# Patient Record
Sex: Female | Born: 1970 | Race: White | Hispanic: No | Marital: Married | Smoking: Never smoker
Health system: Southern US, Community
[De-identification: ages and names within clinical notes are randomized; demographics above are authoritative.]

## PROBLEM LIST (undated history)

## (undated) DIAGNOSIS — Z86018 Personal history of other benign neoplasm: Secondary | ICD-10-CM

## (undated) DIAGNOSIS — R87619 Unspecified abnormal cytological findings in specimens from cervix uteri: Secondary | ICD-10-CM

## (undated) HISTORY — PX: ABDOMINAL HYSTERECTOMY: SHX81

## (undated) HISTORY — DX: Unspecified abnormal cytological findings in specimens from cervix uteri: R87.619

## (undated) HISTORY — DX: Personal history of other benign neoplasm: Z86.018

---

## 1995-10-08 HISTORY — PX: BREAST BIOPSY: SHX20

## 1996-10-07 DIAGNOSIS — R87619 Unspecified abnormal cytological findings in specimens from cervix uteri: Secondary | ICD-10-CM

## 1996-10-07 HISTORY — DX: Unspecified abnormal cytological findings in specimens from cervix uteri: R87.619

## 2011-10-08 HISTORY — PX: PARTIAL HYSTERECTOMY: SHX80

## 2014-12-14 ENCOUNTER — Ambulatory Visit: Payer: Self-pay | Admitting: Family Medicine

## 2015-08-30 ENCOUNTER — Encounter: Payer: Self-pay | Admitting: Obstetrics and Gynecology

## 2015-08-30 ENCOUNTER — Telehealth: Payer: Self-pay | Admitting: Obstetrics and Gynecology

## 2015-08-30 ENCOUNTER — Ambulatory Visit (INDEPENDENT_AMBULATORY_CARE_PROVIDER_SITE_OTHER): Payer: BLUE CROSS/BLUE SHIELD | Admitting: Obstetrics and Gynecology

## 2015-08-30 VITALS — BP 132/76 | HR 83 | Ht 70.0 in | Wt 180.2 lb

## 2015-08-30 DIAGNOSIS — Z01419 Encounter for gynecological examination (general) (routine) without abnormal findings: Secondary | ICD-10-CM | POA: Diagnosis not present

## 2015-08-30 DIAGNOSIS — Z1239 Encounter for other screening for malignant neoplasm of breast: Secondary | ICD-10-CM

## 2015-08-30 DIAGNOSIS — N941 Unspecified dyspareunia: Secondary | ICD-10-CM | POA: Diagnosis not present

## 2015-08-30 DIAGNOSIS — N760 Acute vaginitis: Secondary | ICD-10-CM

## 2015-08-30 DIAGNOSIS — N898 Other specified noninflammatory disorders of vagina: Secondary | ICD-10-CM | POA: Diagnosis not present

## 2015-08-30 MED ORDER — FLUCONAZOLE 150 MG PO TABS: 150.0000 mg | ORAL_TABLET | Freq: Once | ORAL | Status: DC

## 2015-08-30 MED ORDER — METRONIDAZOLE 500 MG PO TABS: 500.0000 mg | ORAL_TABLET | Freq: Two times a day (BID) | ORAL | Status: DC

## 2015-08-30 MED ORDER — ESTROGENS, CONJUGATED 0.625 MG/GM VA CREA: 1.0000 | TOPICAL_CREAM | Freq: Every day | VAGINAL | Status: DC

## 2015-08-30 MED ORDER — ESTRADIOL 10 MCG VA TABS: 1.0000 | ORAL_TABLET | VAGINAL | Status: DC

## 2015-08-30 NOTE — Telephone Encounter (Signed)
Patient called, noted medication was too expensive, desired alternative.  Called in Premarin cream and advised on instructions for use.

## 2015-08-30 NOTE — Patient Instructions (Addendum)
Patient to take course of Flagyl, followed by Diflucan tablet.  To begin taking Vagifem tablet 1 week after treatment antibiotic treatment.     Health Maintenance, Female Adopting a healthy lifestyle and getting preventive care can go a long way to promote health and wellness. Talk with your health care provider about what schedule of regular examinations is right for you. This is a good chance for you to check in with your provider about disease prevention and staying healthy. In between checkups, there are plenty of things you can do on your own. Experts have done a lot of research about which lifestyle changes and preventive measures are most likely to keep you healthy. Ask your health care provider for more information. WEIGHT AND DIET  Eat a healthy diet  Be sure to include plenty of vegetables, fruits, low-fat dairy products, and lean protein.  Do not eat a lot of foods high in solid fats, added sugars, or salt.  Get regular exercise. This is one of the most important things you can do for your health.  Most adults should exercise for at least 150 minutes each week. The exercise should increase your heart rate and make you sweat (moderate-intensity exercise).  Most adults should also do strengthening exercises at least twice a week. This is in addition to the moderate-intensity exercise.  Maintain a healthy weight  Body mass index (BMI) is a measurement that can be used to identify possible weight problems. It estimates body fat based on height and weight. Your health care provider can help determine your BMI and help you achieve or maintain a healthy weight.  For females 56 years of age and older:   A BMI below 18.5 is considered underweight.  A BMI of 18.5 to 24.9 is normal.  A BMI of 25 to 29.9 is considered overweight.  A BMI of 30 and above is considered obese.  Watch levels of cholesterol and blood lipids  You should start having your blood tested for lipids and  cholesterol at 43 years of age, then have this test every 5 years.  You may need to have your cholesterol levels checked more often if:  Your lipid or cholesterol levels are high.  You are older than 43 years of age.  You are at high risk for heart disease.  CANCER SCREENING   Lung Cancer  Lung cancer screening is recommended for adults 35-70 years old who are at high risk for lung cancer because of a history of smoking.  A yearly low-dose CT scan of the lungs is recommended for people who:  Currently smoke.  Have quit within the past 15 years.  Have at least a 30-pack-year history of smoking. A pack year is smoking an average of one pack of cigarettes a day for 1 year.  Yearly screening should continue until it has been 15 years since you quit.  Yearly screening should stop if you develop a health problem that would prevent you from having lung cancer treatment.  Breast Cancer  Practice breast self-awareness. This means understanding how your breasts normally appear and feel.  It also means doing regular breast self-exams. Let your health care provider know about any changes, no matter how small.  If you are in your 20s or 30s, you should have a clinical breast exam (CBE) by a health care provider every 1-3 years as part of a regular health exam.  If you are 56 or older, have a CBE every year. Also consider having a breast  X-ray (mammogram) every year.  If you have a family history of breast cancer, talk to your health care provider about genetic screening.  If you are at high risk for breast cancer, talk to your health care provider about having an MRI and a mammogram every year.  Breast cancer gene (BRCA) assessment is recommended for women who have family members with BRCA-related cancers. BRCA-related cancers include:  Breast.  Ovarian.  Tubal.  Peritoneal cancers.  Results of the assessment will determine the need for genetic counseling and BRCA1 and BRCA2  testing. Cervical Cancer Your health care provider may recommend that you be screened regularly for cancer of the pelvic organs (ovaries, uterus, and vagina). This screening involves a pelvic examination, including checking for microscopic changes to the surface of your cervix (Pap test). You may be encouraged to have this screening done every 3 years, beginning at age 44.  For women ages 59-65, health care providers may recommend pelvic exams and Pap testing every 3 years, or they may recommend the Pap and pelvic exam, combined with testing for human papilloma virus (HPV), every 5 years. Some types of HPV increase your risk of cervical cancer. Testing for HPV may also be done on women of any age with unclear Pap test results.  Other health care providers may not recommend any screening for nonpregnant women who are considered low risk for pelvic cancer and who do not have symptoms. Ask your health care provider if a screening pelvic exam is right for you.  If you have had past treatment for cervical cancer or a condition that could lead to cancer, you need Pap tests and screening for cancer for at least 20 years after your treatment. If Pap tests have been discontinued, your risk factors (such as having a new sexual partner) need to be reassessed to determine if screening should resume. Some women have medical problems that increase the chance of getting cervical cancer. In these cases, your health care provider may recommend more frequent screening and Pap tests. Colorectal Cancer  This type of cancer can be detected and often prevented.  Routine colorectal cancer screening usually begins at 43 years of age and continues through 43 years of age.  Your health care provider may recommend screening at an earlier age if you have risk factors for colon cancer.  Your health care provider may also recommend using home test kits to check for hidden blood in the stool.  A small camera at the end of a  tube can be used to examine your colon directly (sigmoidoscopy or colonoscopy). This is done to check for the earliest forms of colorectal cancer.  Routine screening usually begins at age 39.  Direct examination of the colon should be repeated every 5-10 years through 44 years of age. However, you may need to be screened more often if early forms of precancerous polyps or small growths are found. Skin Cancer  Check your skin from head to toe regularly.  Tell your health care provider about any new moles or changes in moles, especially if there is a change in a mole's shape or color.  Also tell your health care provider if you have a mole that is larger than the size of a pencil eraser.  Always use sunscreen. Apply sunscreen liberally and repeatedly throughout the day.  Protect yourself by wearing long sleeves, pants, a wide-brimmed hat, and sunglasses whenever you are outside. HEART DISEASE, DIABETES, AND HIGH BLOOD PRESSURE   High blood pressure causes heart  disease and increases the risk of stroke. High blood pressure is more likely to develop in:  People who have blood pressure in the high end of the normal range (130-139/85-89 mm Hg).  People who are overweight or obese.  People who are African American.  If you are 71-41 years of age, have your blood pressure checked every 3-5 years. If you are 58 years of age or older, have your blood pressure checked every year. You should have your blood pressure measured twice--once when you are at a hospital or clinic, and once when you are not at a hospital or clinic. Record the average of the two measurements. To check your blood pressure when you are not at a hospital or clinic, you can use:  An automated blood pressure machine at a pharmacy.  A home blood pressure monitor.  If you are between 61 years and 78 years old, ask your health care provider if you should take aspirin to prevent strokes.  Have regular diabetes screenings. This  involves taking a blood sample to check your fasting blood sugar level.  If you are at a normal weight and have a low risk for diabetes, have this test once every three years after 43 years of age.  If you are overweight and have a high risk for diabetes, consider being tested at a younger age or more often. PREVENTING INFECTION  Hepatitis B  If you have a higher risk for hepatitis B, you should be screened for this virus. You are considered at high risk for hepatitis B if:  You were born in a country where hepatitis B is common. Ask your health care provider which countries are considered high risk.  Your parents were born in a high-risk country, and you have not been immunized against hepatitis B (hepatitis B vaccine).  You have HIV or AIDS.  You use needles to inject street drugs.  You live with someone who has hepatitis B.  You have had sex with someone who has hepatitis B.  You get hemodialysis treatment.  You take certain medicines for conditions, including cancer, organ transplantation, and autoimmune conditions. Hepatitis C  Blood testing is recommended for:  Everyone born from [birth year] through 1964.  Anyone with known risk factors for hepatitis C. Sexually transmitted infections (STIs)  You should be screened for sexually transmitted infections (STIs) including gonorrhea and chlamydia if:  You are sexually active and are younger than 43 years of age.  You are older than 43 years of age and your health care provider tells you that you are at risk for this type of infection.  Your sexual activity has changed since you were last screened and you are at an increased risk for chlamydia or gonorrhea. Ask your health care provider if you are at risk.  If you do not have HIV, but are at risk, it may be recommended that you take a prescription medicine daily to prevent HIV infection. This is called pre-exposure prophylaxis (PrEP). You are considered at risk if:  You are  sexually active and do not regularly use condoms or know the HIV status of your partner(s).  You take drugs by injection.  You are sexually active with a partner who has HIV. Talk with your health care provider about whether you are at high risk of being infected with HIV. If you choose to begin PrEP, you should first be tested for HIV. You should then be tested every 3 months for as long as you are taking  PrEP.  PREGNANCY   If you are premenopausal and you may become pregnant, ask your health care provider about preconception counseling.  If you may become pregnant, take 400 to 800 micrograms (mcg) of folic acid every day.  If you want to prevent pregnancy, talk to your health care provider about birth control (contraception). OSTEOPOROSIS AND MENOPAUSE   Osteoporosis is a disease in which the bones lose minerals and strength with aging. This can result in serious bone fractures. Your risk for osteoporosis can be identified using a bone density scan.  If you are 65 years of age or older, or if you are at risk for osteoporosis and fractures, ask your health care provider if you should be screened.  Ask your health care provider whether you should take a calcium or vitamin D supplement to lower your risk for osteoporosis.  Menopause may have certain physical symptoms and risks.  Hormone replacement therapy may reduce some of these symptoms and risks. Talk to your health care provider about whether hormone replacement therapy is right for you.  HOME CARE INSTRUCTIONS   Schedule regular health, dental, and eye exams.  Stay current with your immunizations.   Do not use any tobacco products including cigarettes, chewing tobacco, or electronic cigarettes.  If you are pregnant, do not drink alcohol.  If you are breastfeeding, limit how much and how often you drink alcohol.  Limit alcohol intake to no more than 1 drink per day for nonpregnant women. One drink equals 12 ounces of beer, 5  ounces of wine, or 1 ounces of hard liquor.  Do not use street drugs.  Do not share needles.  Ask your health care provider for help if you need support or information about quitting drugs.  Tell your health care provider if you often feel depressed.  Tell your health care provider if you have ever been abused or do not feel safe at home.   This information is not intended to replace advice given to you by your health care provider. Make sure you discuss any questions you have with your health care provider.   Document Released: 04/08/2011 Document Revised: 10/14/2014 Document Reviewed: 08/25/2013 Elsevier Interactive Patient Education [employer].

## 2015-08-30 NOTE — Progress Notes (Signed)
ANNUAL PREVENTATIVE CARE GYN  ENCOUNTER NOTE  Subjective:       Margaret Kane is a 44 y.o. [id number] married female here for a routine annual gynecologic exam and to establish care.  [place]. The patient is sexually active. GYN screening history: last pap: approximate date 2015 and was normal.  Last mammogram: 6 months ago, abnormal screen with diagnostic mammo normal.  The patient wears seatbelts: yes. The patient participates in regular exercise: yes ( 3 x weekly). Has the patient ever been transfused or tattooed?: no. The patient reports that there is not domestic violence in her life.  Current complaints: 1.  Vaginal dryness with itching and dyspareunia 2. Desires to know more about 3D Mammography as she is currently having to receive mammograms q 6 months due to intermittent findings on screening mammograms and ultrasound. Has been on q 6 month screens x 1.5 years.   Gynecologic History No LMP recorded. Patient has had a hysterectomy 3 years ago. Contraception: status post hysterectomy Denies h/o STIs.  H/o abnormal pap x 1, 20 years ago, all other paps wnl.   Obstetric History OB History  Gravida Para Term Preterm AB SAB TAB Ectopic Multiple Living  1 1 1       1     # Outcome Date GA Lbr Len/2nd Weight Sex Delivery Anes PTL Lv  1 Term 2003 [id number]  6 lb 8 oz (2.948 kg) F Vag-Spont None N Y      Past Medical History  Diagnosis Date  . Pap smear abnormality of cervix 1998    x 1, all normal since then.     Past Surgical History  Procedure Laterality Date  . Hysterectomy  2013    History reviewed. No pertinent family history.   Social History   Social History  . Marital Status: Married    Spouse Name: N/A  . Number of Children: N/A  . Years of Education: N/A   Occupational History  . Not on file.   Social History Main Topics  . Smoking status: Never Smoker   . Smokeless tobacco: Not on file  . Alcohol Use: 1.8 oz/week    3  Glasses of wine per week  . Drug Use: No  . Sexual Activity: Yes    Birth Control/ Protection: Surgical   Other Topics Concern  . Not on file   Social History Narrative  . No narrative on file   No current outpatient prescriptions on file prior to visit.   No current facility-administered medications on file prior to visit.    Allergies  Allergen Reactions  . Tetracyclines & Related     Review of Systems ROS Review of Systems - General ROS: negative for - chills, fatigue, fever, hot flashes, night sweats, weight gain or weight loss Psychological ROS: negative for - anxiety, decreased libido, depression, mood swings, physical abuse or sexual abuse Ophthalmic ROS: negative for - blurry vision, eye pain or loss of vision ENT ROS: negative for - headaches, hearing change, visual changes or vocal changes Allergy and Immunology ROS: negative for - hives, itchy/watery eyes or seasonal allergies Hematological and Lymphatic ROS: negative for - bleeding problems, bruising, swollen lymph nodes or weight loss Endocrine ROS: negative for - galactorrhea, hair pattern changes, hot flashes, malaise/lethargy, mood swings, palpitations, polydipsia/polyuria, skin changes, temperature intolerance or unexpected weight changes Breast ROS: negative for - new or changing breast lumps or nipple discharge Respiratory ROS: negative for - cough or shortness of  breath Cardiovascular ROS: negative for - chest pain, irregular heartbeat, palpitations or shortness of breath Gastrointestinal ROS: no abdominal pain, change in bowel habits, or black or bloody stools Genito-Urinary ROS: no dysuria, trouble voiding, or hematuria. Positive for vaginal dryness, vaginal discharge (white, thin, no odor), vaginal itching, and dyspareunia.  Musculoskeletal ROS: negative for - joint pain or joint stiffness Neurological ROS: negative for - bowel and bladder control changes Dermatological ROS: negative for rash and skin  lesion changes   Objective:   BP 132/76 mmHg  Pulse 83  Ht  (1.778 m)  Wt 180 lb 3.2 oz (81.738 kg)  BMI 25.86 kg/m2  LMP  CONSTITUTIONAL: Well-developed, well-nourished female in no acute distress.  PSYCHIATRIC: Normal mood and affect. Normal behavior. Normal judgment and thought content. NEUROLGIC: Alert and oriented to person, place, and time. Normal muscle tone coordination. No cranial nerve deficit noted. HENT:  Normocephalic, atraumatic, External right and left ear normal. Oropharynx is clear and moist EYES: Conjunctivae and EOM are normal. Pupils are equal, round, and reactive to light. No scleral icterus.  NECK: Normal range of motion, supple, no masses.  Normal thyroid.  SKIN: Skin is warm and dry. No rash noted. Not diaphoretic. No erythema. No pallor. CARDIOVASCULAR: Normal heart rate noted, regular rhythm, no murmur. RESPIRATORY: Clear to auscultation bilaterally. Effort and breath sounds normal, no problems with respiration noted. BREASTS: Symmetric in size. No masses, skin changes, nipple drainage, or lymphadenopathy. ABDOMEN: Soft, normal bowel sounds, no distention noted.  No tenderness, rebound or guarding.  BLADDER: Normal PELVIC: Bladder no bladder distension noted Urethra: normal appearing urethra with no masses, tenderness or lesions Vulva: normal appearing vulva with no masses, tenderness or lesions Vagina: mildly atrophic appearing vagina with small amount of white thin discharge, no lesions.  Vaginal cuff nontender, well healed.  Cervix: surgically absent.  Uterus: surgically absent. Adnexa: normal adnexa in size, nontender and no masses Rectal: normal external exam, small external hemorrhoids present, normal sphincter tone; internal exam deferred. MUSCULOSKELETAL: Normal range of motion. No tenderness.  No cyanosis, clubbing, or edema.  2+ distal  pulses. LYMPHATIC: No Axillary, Supraclavicular, or Inguinal Adenopathy.   Microscopic wet-mount exam shows clue cells.  No trichomonads, yeast, WBCS.  KOH done.    Assessment:   Annual gynecologic examination 44 y.o. Contraception: status post hysterectomy Normal BMI Vaginal dryness with dyspareunia Vaginitis (BV)  Plan:  Pap: Not needed Mammogram: Ordered.  Discussed 3D mammograms with patient.  Will order.  Stool Guaiac Testing:  Not Indicated Labs: Patient reports recent labs done by another physician for workup to r/o Sjogren's syndrome Routine preventative health maintenance measures emphasized: Exercise/Diet/Weight control Vaginal dryness with dyspareunia - discussion had on management options.  Patient without vasomotor symptoms so would benefit from local therapy.  Discussed hormonal and non-hormonal treatment options.  Patient has already tried vaginal lubricants and moisturizers with small amount of relief. Will treat with Vagifem. To begin after treatment for vaginitis. Will check hormone levels to assess perimenopausal status.   Will treat vaginitis with Flagyl.  Also prescribed prophylactic dose of Diflucan for possible yeast development.  Return to Clinic - 6 weeks to f/u on medication and symptoms.    Hildred Laser, MD  [facility]

## 2015-08-30 NOTE — Telephone Encounter (Signed)
Margaret Kane went to [facility] to pick up Vagisin? It was $270.00 w/ ins. She would like something generic or a lot cheaper

## 2015-10-11 ENCOUNTER — Encounter: Payer: Self-pay | Admitting: Obstetrics and Gynecology

## 2015-10-11 ENCOUNTER — Ambulatory Visit: Payer: BLUE CROSS/BLUE SHIELD | Admitting: Obstetrics and Gynecology

## 2015-10-11 ENCOUNTER — Ambulatory Visit (INDEPENDENT_AMBULATORY_CARE_PROVIDER_SITE_OTHER): Payer: BLUE CROSS/BLUE SHIELD | Admitting: Obstetrics and Gynecology

## 2015-10-11 VITALS — BP 129/84 | HR 76 | Ht 70.0 in | Wt 179.9 lb

## 2015-10-11 DIAGNOSIS — N952 Postmenopausal atrophic vaginitis: Secondary | ICD-10-CM

## 2015-10-11 DIAGNOSIS — N941 Unspecified dyspareunia: Secondary | ICD-10-CM

## 2015-10-11 NOTE — Progress Notes (Signed)
GYNECOLOGY PROGRESS NOTE  Subjective:    Patient ID: Margaret Kane, female    DOB: [birth year], 45 y.o.   MRN: [id number]  HPI  Patient is a 45 y.o. [id number] female who presents for 6 week f/u of vaginal atrophy.  Was prescribed Vagifem initially, but due to cost, had to be switched to Premarin cream.  Patient notes that the cream has been working well for her, and no longer experiences vaginal dryness.  Patient does report mild side effects of occasional pelvic cramping and acne, but notes that this is not bothersome. Was also treated for BV last visit.   The following portions of the patient's history were reviewed and updated as appropriate: allergies, current medications, past family history, past medical history, past social history, past surgical history and problem list.  Review of Systems Pertinent items noted in HPI and remainder of comprehensive ROS otherwise negative.   Objective:   Blood pressure 129/84, pulse 76, height 5\' 10"  (1.778 m), weight 179 lb 14.4 oz (81.602 kg). General appearance: alert and no distress Abdomen: soft, non-tender; bowel sounds normal; no masses,  no organomegaly Pelvic:  Vagina: mildly atrophic appearing vagina (improved since last visit), no lesions. Vaginal cuff nontender.  Cervix: surgically absent.  Uterus: surgically absent. Adnexa: normal adnexa in size, nontender and no masses Rectal: normal external exam, small external hemorrhoids present, normal sphincter tone; internal exam deferred. Extremities: extremities normal, atraumatic, no cyanosis or edema Neurologic: Grossly normal   Assessment:   Vaginal dryness with dyspareunia, improving.    Plan:   Patient to continue using Premarin cream as prescribed, 1-2 times weekly.  To discontinue if adverse side effects or bleeding occurs.  To f/u in 6 months.     Hildred LaserAnika Saesha Llerenas, MD [facility] 10/11/2015 9:40 AM

## 2015-10-18 ENCOUNTER — Telehealth: Payer: Self-pay | Admitting: Obstetrics and Gynecology

## 2015-10-18 ENCOUNTER — Other Ambulatory Visit: Payer: Self-pay | Admitting: *Deleted

## 2015-10-18 ENCOUNTER — Inpatient Hospital Stay
Admission: RE | Admit: 2015-10-18 | Discharge: 2015-10-18 | Disposition: A | Discharge status: Home / Self Care | Payer: Self-pay | Source: Ambulatory Visit | Attending: *Deleted | Admitting: *Deleted

## 2015-10-18 DIAGNOSIS — Z9289 Personal history of other medical treatment: Secondary | ICD-10-CM

## 2015-10-18 DIAGNOSIS — Z87898 Personal history of other specified conditions: Secondary | ICD-10-CM

## 2015-10-18 NOTE — Telephone Encounter (Signed)
[facility] called her and said she needed a diagnostic mammo intead of bil screening (does she need an US too)

## 2015-10-19 NOTE — Telephone Encounter (Signed)
Will order both in case they need it.

## 2015-11-15 ENCOUNTER — Encounter: Payer: BLUE CROSS/BLUE SHIELD | Admitting: Obstetrics and Gynecology

## 2015-11-17 ENCOUNTER — Ambulatory Visit
Admission: RE | Admit: 2015-11-17 | Discharge: 2015-11-17 | Disposition: A | Discharge status: Home / Self Care | Payer: BC Managed Care – PPO | Source: Ambulatory Visit | Attending: Obstetrics and Gynecology | Admitting: Obstetrics and Gynecology

## 2015-11-17 ENCOUNTER — Other Ambulatory Visit: Payer: Self-pay | Admitting: Obstetrics and Gynecology

## 2015-11-17 DIAGNOSIS — R921 Mammographic calcification found on diagnostic imaging of breast: Secondary | ICD-10-CM | POA: Diagnosis not present

## 2015-11-17 DIAGNOSIS — Z87898 Personal history of other specified conditions: Secondary | ICD-10-CM

## 2016-03-11 IMAGING — MG MM DIAG BREAST TOMO BILATERAL
8 of 17 series · 8 of 33 positions shown · non-contrast
Comparison: Multiple prior studies including 01/05/2015 and earlier
studies from [facility], [facility] [REDACTED], and [REDACTED]

CLINICAL DATA: Followup for left breast calcifications. Patient has
been previously evaluated at [facility].

EXAM:
DIGITAL DIAGNOSTIC BILATERAL MAMMOGRAM WITH 3D TOMOSYNTHESIS AND CAD

[L ML]
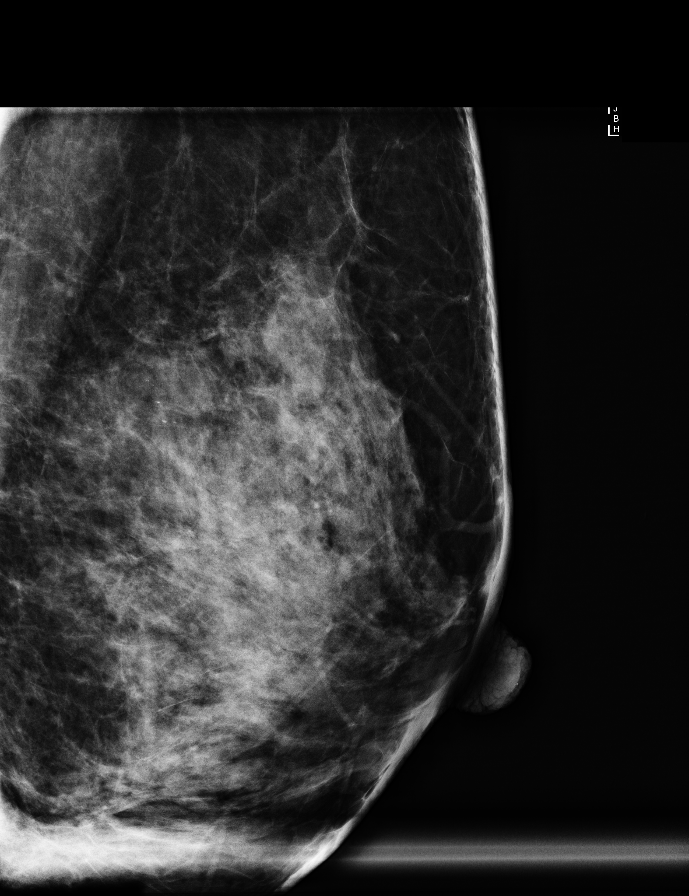

[L CC]
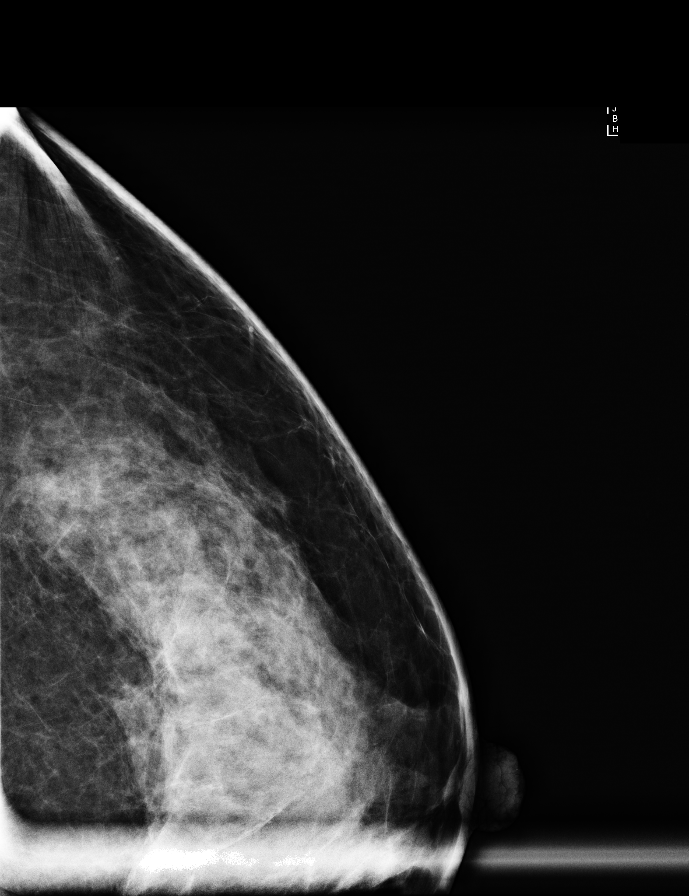

[R MLO synth-2D]
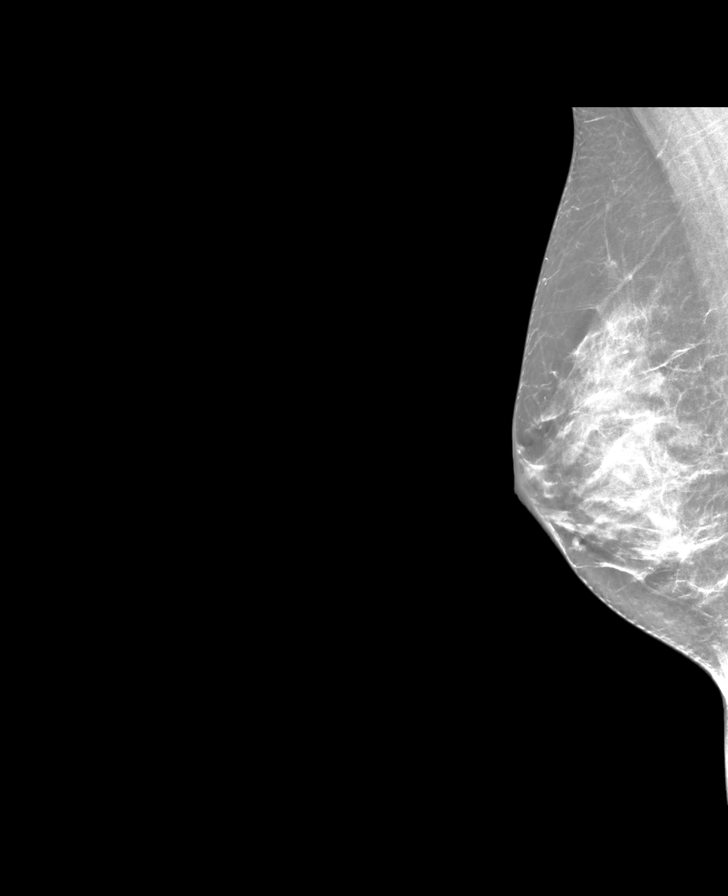

[L CC synth-2D]
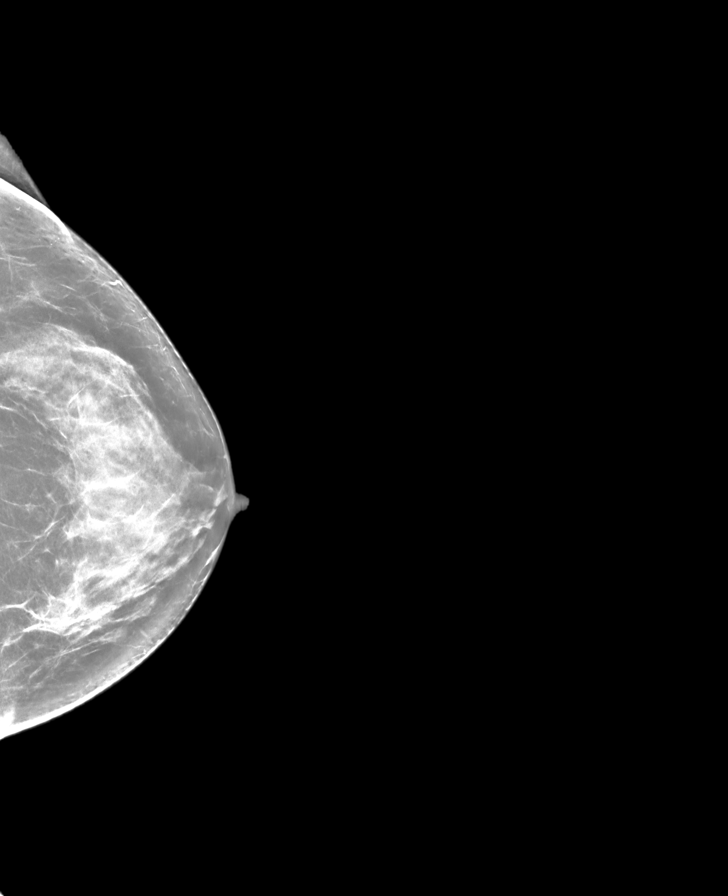

[R MLO]
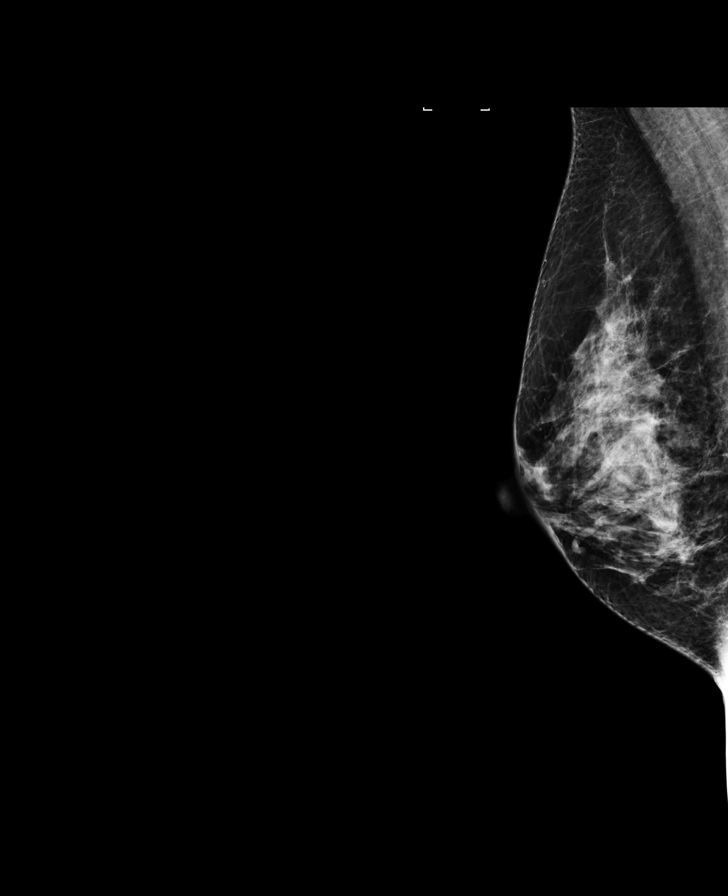

[L MLO]
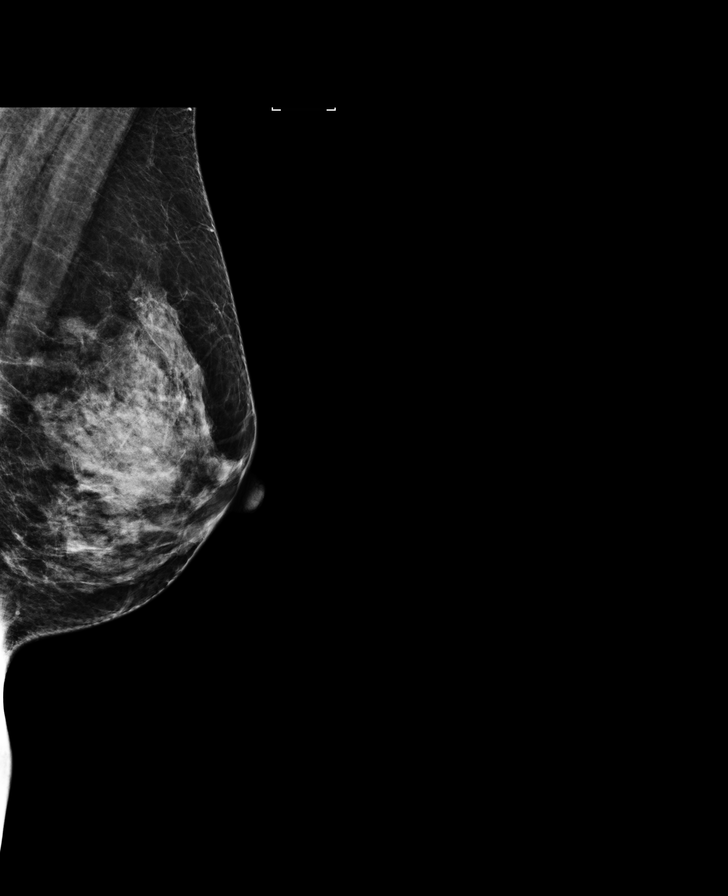

[R CC]
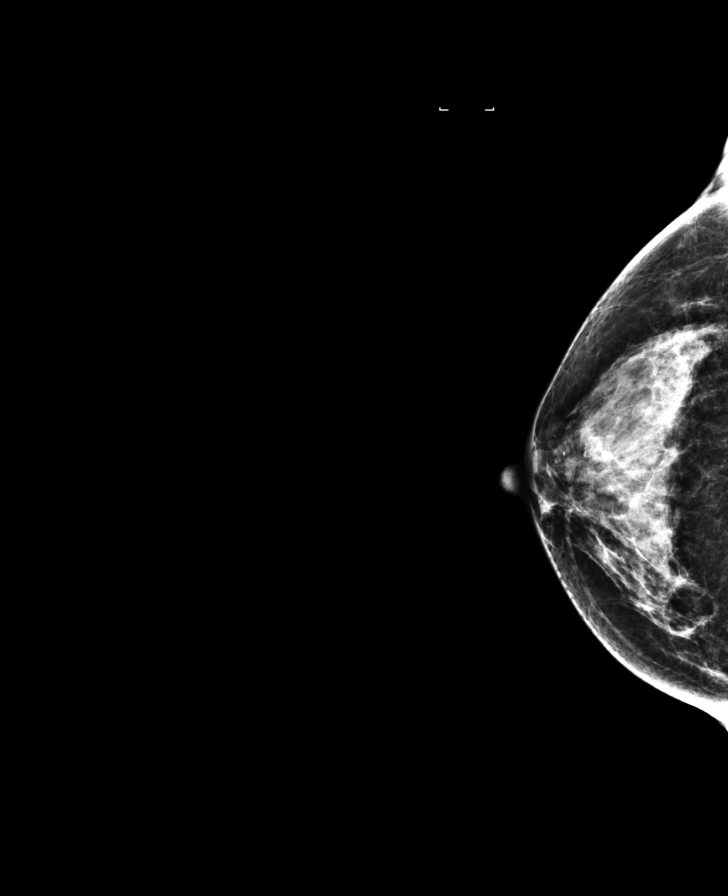

[R CC synth-2D]
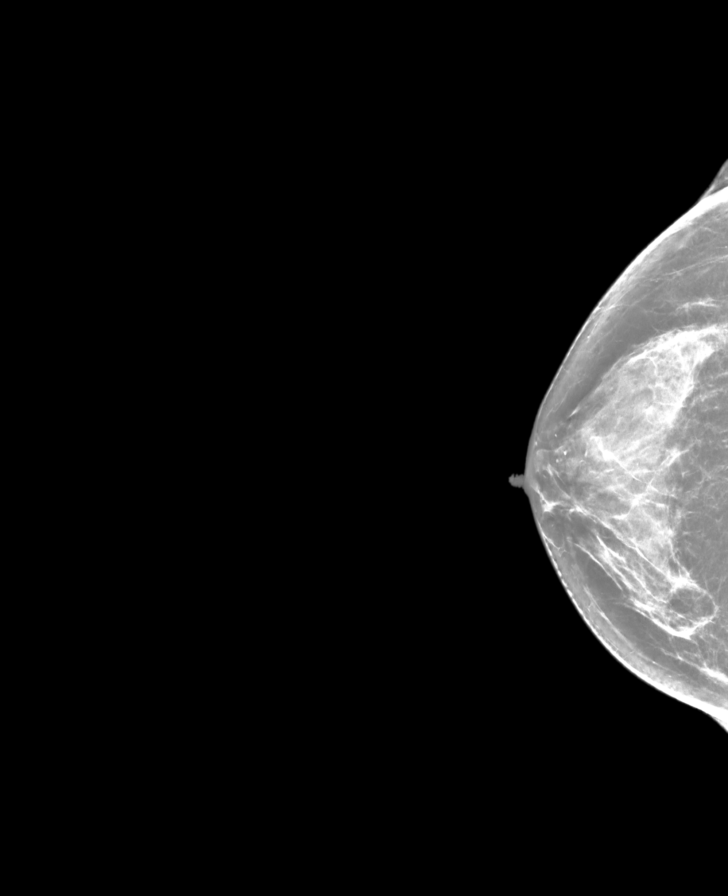

[8 of 33 positions shown; findings below may reference images not displayed]

ACR Breast Density Category c: The breast tissue is heterogeneously
dense, which may obscure small masses.
FINDINGS: Calcifications are identified in the upper-outer quadrant of the
left breast. On magnified views these demonstrate primarily rounded
morphology on craniocaudal view and layering on true lateral view,
consistent with milk of calcium and benign. No suspicious mass,
distortion, or microcalcifications are identified to suggest
presence of malignancy.

Mammographic images were processed with CAD.
IMPRESSION: No mammographic evidence for malignancy. Benign left breast
calcifications.

RECOMMENDATION:
Screening mammogram in one year.(Code:[id number])

I have discussed the findings and recommendations with the patient.
Results were also provided in writing at the conclusion of the
visit. If applicable, a reminder letter will be sent to the patient
regarding the next appointment.

BI-RADS CATEGORY  2: Benign.

## 2016-04-23 ENCOUNTER — Ambulatory Visit: Payer: BLUE CROSS/BLUE SHIELD | Admitting: Obstetrics and Gynecology

## 2016-05-09 ENCOUNTER — Ambulatory Visit (INDEPENDENT_AMBULATORY_CARE_PROVIDER_SITE_OTHER): Payer: BLUE CROSS/BLUE SHIELD | Admitting: Obstetrics and Gynecology

## 2016-05-09 ENCOUNTER — Encounter: Payer: Self-pay | Admitting: Obstetrics and Gynecology

## 2016-05-09 VITALS — BP 121/70 | HR 77 | Ht 70.0 in | Wt 174.0 lb

## 2016-05-09 DIAGNOSIS — N952 Postmenopausal atrophic vaginitis: Secondary | ICD-10-CM

## 2016-05-09 NOTE — Progress Notes (Signed)
    GYNECOLOGY PROGRESS NOTE  Subjective:    Patient ID: Teofilo Pod, female    DOB: June 24, 1971, 45 y.o.   MRN: [id number]  HPI  Patient is a 45 y.o. G22P1001 female who presents for f./u vaginal atrophy.  Notes using cream for ~ 1 month and then symptoms resolved.  Has not used since January.  Doing well otherwise.   The following portions of the patient's history were reviewed and updated as appropriate: allergies, current medications, past family history, past medical history, past social history, past surgical history and problem list.  Review of Systems Pertinent items noted in HPI and remainder of comprehensive ROS otherwise negative.   Objective:   Blood pressure 121/70, pulse 77, height 5\' 10"  (1.778 m), weight 174 lb (78.9 kg). General appearance: alert and no distress Exam deferred   Assessment:   Vaginal atrophy  Plan:   - Vaginal atrophy symptoms improved with short-term use of Premarin cream. Patient can continue to use cream when necessary. - F/u in 6 months for annual exam.    Hildred Laser, MD [facility]

## 2016-05-11 DIAGNOSIS — N952 Postmenopausal atrophic vaginitis: Secondary | ICD-10-CM | POA: Insufficient documentation

## 2016-09-05 ENCOUNTER — Telehealth: Payer: Self-pay | Admitting: Obstetrics and Gynecology

## 2016-09-05 ENCOUNTER — Other Ambulatory Visit: Payer: Self-pay | Admitting: Obstetrics and Gynecology

## 2016-09-05 NOTE — Telephone Encounter (Signed)
Done RX sent

## 2016-09-05 NOTE — Telephone Encounter (Signed)
Patient called stating she has a terrible yeast infection and needs a refill on her script. She uses the [facility] in [place].Thanks

## 2016-09-11 ENCOUNTER — Telehealth: Payer: Self-pay | Admitting: Obstetrics and Gynecology

## 2016-09-11 DIAGNOSIS — B3731 Acute candidiasis of vulva and vagina: Secondary | ICD-10-CM

## 2016-09-11 DIAGNOSIS — B373 Candidiasis of vulva and vagina: Secondary | ICD-10-CM

## 2016-09-11 MED ORDER — FLUCONAZOLE 150 MG PO TABS: ORAL_TABLET | ORAL | 1 refills | Status: DC

## 2016-09-11 NOTE — Telephone Encounter (Signed)
Will send one additional tablet, if sx persist pt will need to be seen.

## 2016-09-11 NOTE — Telephone Encounter (Signed)
PT CALLED AND SHE WAS GIVEN MEDS ON Monday FOR A YEAST INFECTION, IT IS BETTER BUT SHE WANTED TO KNOW IF THERE WAS A CHANCE SHE COULD GET ANOTHER ONE.

## 2016-11-13 ENCOUNTER — Encounter: Payer: BLUE CROSS/BLUE SHIELD | Admitting: Obstetrics and Gynecology

## 2016-12-04 NOTE — Progress Notes (Signed)
GYNECOLOGY ANNUAL PHYSICAL EXAM PROGRESS NOTE  Subjective:    Margaret Kane is a 46 y.o. 761P1001 female who presents for an annual exam. The patient has no complaints today. The patient is sexually active.The patient wears seatbelts: yes. The patient participates in regular exercise: yes (1-2 times weekly).  Has the patient ever been transfused or tattooed?: no. The patient reports that there is not domestic violence in her life.    Gynecologic History Menarche age: [age] No LMP recorded. Patient has had a hysterectomy. Contraception: status post hysterectomy History of STI's: Denies Last Pap: 2015. Results were: normal.  Denies h/o abnormal pap smears. Last mammogram: 11/2015. Results were: normal.    Obstetric History   G1   P1   T1   P0   A0   L1    SAB0   TAB0   Ectopic0   Multiple0   Live Births1     # Outcome Date GA Lbr Len/2nd Weight Sex Delivery Anes PTL Lv  1 Term 2003 [id number]  6 lb 8 oz (2.948 kg) F Vag-Spont None N LIV      Past Medical History:  Diagnosis Date  . Pap smear abnormality of cervix 1998   x 1, all normal since then.     Past Surgical History:  Procedure Laterality Date  . BREAST BIOPSY Right 1997   benign mass  . PARTIAL HYSTERECTOMY  2013    History reviewed. No pertinent family history.  Social History   Social History  . Marital status: Married    Spouse name: N/A  . Number of children: N/A  . Years of education: N/A   Occupational History  . Not on file.   Social History Main Topics  . Smoking status: Never Smoker  . Smokeless tobacco: Never Used  . Alcohol use 1.8 oz/week    3 Glasses of wine per week  . Drug use: No  . Sexual activity: Yes    Birth control/ protection: Surgical   Other Topics Concern  . Not on file   Social History Narrative  . No narrative on file    Current Outpatient Prescriptions on File Prior to Visit  Medication Sig Dispense Refill  . fluconazole (DIFLUCAN) 150 MG tablet Take one tablet  by mouth 1 tablet 1   No current facility-administered medications on file prior to visit.     Allergies  Allergen Reactions  . Tetracyclines & Related      Review of Systems Constitutional: negative for chills, fatigue, fevers and sweats Eyes: negative for irritation, redness and visual disturbance Ears, nose, mouth, throat, and face: negative for hearing loss, nasal congestion, snoring and tinnitus Respiratory: negative for asthma, cough, sputum Cardiovascular: negative for chest pain, dyspnea, exertional chest pressure/discomfort, irregular heart beat, palpitations and syncope Gastrointestinal: negative for abdominal pain, change in bowel habits, nausea and vomiting Genitourinary: negative for abnormal menstrual periods, genital lesions, sexual problems and vaginal discharge, dysuria and urinary incontinence Integument/breast: negative for breast lump, breast tenderness and nipple discharge Hematologic/lymphatic: negative for bleeding and easy bruising Musculoskeletal:negative for back pain and muscle weakness Neurological: negative for dizziness, headaches, vertigo and weakness Endocrine: negative for diabetic symptoms including polydipsia, polyuria and skin dryness Allergic/Immunologic: negative for hay fever and urticaria        Objective:  Blood pressure 137/89, pulse 81, height 5\' 10"  (1.778 m), weight 181 lb 1.6 oz (82.1 kg). Body mass index is 25.99 kg/m.   General Appearance:    Alert, cooperative, no  distress, appears stated age, overweight  Head:    Normocephalic, without obvious abnormality, atraumatic  Eyes:    PERRL, conjunctiva/corneas clear, EOM's intact, both eyes  Ears:    Normal external ear canals, both ears  Nose:   Nares normal, septum midline, mucosa normal, no drainage or sinus tenderness  Throat:   Lips, mucosa, and tongue normal; teeth and gums normal  Neck:   Supple, symmetrical, trachea midline, no adenopathy; thyroid: no  enlargement/tenderness/nodules; no carotid bruit or JVD  Back:     Symmetric, no curvature, ROM normal, no CVA tenderness  Lungs:     Clear to auscultation bilaterally, respirations unlabored  Chest Wall:    No tenderness or deformity   Heart:    Regular rate and rhythm, S1 and S2 normal, no murmur, rub or gallop  Breast Exam:    No tenderness, masses, or nipple abnormality  Abdomen:     Soft, non-tender, bowel sounds active all four quadrants, no masses, no organomegaly.    Genitalia:    Pelvic:external genitalia normal, vagina without lesions, discharge, or tenderness, rectovaginal septum  normal. Mild atrophy present. Cervix normal in appearance, no cervical motion tenderness, no adnexal masses or tenderness.  Uterus normal size, shape, mobile, regular contours, nontender.  Rectal:    Normal external sphincter.  No hemorrhoids appreciated. Internal exam not done.   Extremities:   Extremities normal, atraumatic, no cyanosis or edema  Pulses:   2+ and symmetric all extremities  Skin:   Skin color, texture, turgor normal, no rashes or lesions  Lymph nodes:   Cervical, supraclavicular, and axillary nodes normal  Neurologic:   CNII-XII intact, normal strength, sensation and reflexes throughout   .  Labs:  No results found for: WBC, HGB, HCT, MCV, PLT  No results found for: CREATININE, BUN, NA, K, CL, CO2  No results found for: ALT, AST, GGT, ALKPHOS, BILITOT  No results found for: TSH   Assessment:    Healthy female exam   Overweight  Vaginal atrophy (mild)  Plan:    Had bloodwork done at [facility] on 11/05/2016.  Labs reviewed on Care Everywhere, normal.  Breast self exam technique reviewed and patient encouraged to perform self-exam monthly. Contraception: vasectomy. Discussed healthy lifestyle modifications. Mammogram ordered. Pap smear not indicated (patient s/p hysterectomy, no longer needs pap smears).   Vaginal atrophy (mild, not bothersome currently). Can use  Premarin cream or OTC vaginal moisturizers as needed.  Follow up in 1 year, or as needed.    Hildred Laser, MD [facility]

## 2016-12-05 ENCOUNTER — Ambulatory Visit (INDEPENDENT_AMBULATORY_CARE_PROVIDER_SITE_OTHER): Payer: BLUE CROSS/BLUE SHIELD | Admitting: Obstetrics and Gynecology

## 2016-12-05 ENCOUNTER — Encounter: Payer: Self-pay | Admitting: Obstetrics and Gynecology

## 2016-12-05 VITALS — BP 137/89 | HR 81 | Ht 70.0 in | Wt 181.1 lb

## 2016-12-05 DIAGNOSIS — E663 Overweight: Secondary | ICD-10-CM | POA: Diagnosis not present

## 2016-12-05 DIAGNOSIS — Z01419 Encounter for gynecological examination (general) (routine) without abnormal findings: Secondary | ICD-10-CM | POA: Diagnosis not present

## 2016-12-05 DIAGNOSIS — N952 Postmenopausal atrophic vaginitis: Secondary | ICD-10-CM | POA: Diagnosis not present

## 2016-12-05 DIAGNOSIS — Z1231 Encounter for screening mammogram for malignant neoplasm of breast: Secondary | ICD-10-CM | POA: Diagnosis not present

## 2017-01-17 ENCOUNTER — Ambulatory Visit
Admission: RE | Admit: 2017-01-17 | Discharge: 2017-01-17 | Disposition: A | Discharge status: Home / Self Care | Payer: BLUE CROSS/BLUE SHIELD | Source: Ambulatory Visit | Attending: Obstetrics and Gynecology | Admitting: Obstetrics and Gynecology

## 2017-01-17 DIAGNOSIS — Z01419 Encounter for gynecological examination (general) (routine) without abnormal findings: Secondary | ICD-10-CM

## 2017-01-17 DIAGNOSIS — Z1231 Encounter for screening mammogram for malignant neoplasm of breast: Secondary | ICD-10-CM

## 2017-02-10 ENCOUNTER — Ambulatory Visit
Admission: EM | Admit: 2017-02-10 | Discharge: 2017-02-10 | Disposition: A | Discharge status: Home / Self Care | Payer: BLUE CROSS/BLUE SHIELD | Attending: Family Medicine | Admitting: Family Medicine

## 2017-02-10 DIAGNOSIS — L03116 Cellulitis of left lower limb: Secondary | ICD-10-CM

## 2017-02-10 DIAGNOSIS — T63441A Toxic effect of venom of bees, accidental (unintentional), initial encounter: Secondary | ICD-10-CM

## 2017-02-10 DIAGNOSIS — L03032 Cellulitis of left toe: Secondary | ICD-10-CM | POA: Diagnosis not present

## 2017-02-10 MED ORDER — SULFAMETHOXAZOLE-TRIMETHOPRIM 800-160 MG PO TABS: 1.0000 | ORAL_TABLET | Freq: Two times a day (BID) | ORAL | 0 refills | Status: DC

## 2017-02-10 NOTE — ED Provider Notes (Signed)
[facility]    CSN: [id number] Arrival date & time: 02/10/17  1138     History   Chief Complaint Chief Complaint  Patient presents with  . Insect Bite    HPI Margaret Kane is a 46 y.o. female.   46 yo female with a c/o left 4th toe redness, swelling and pain since bee sting 3 days ago. Denies any drainage, fevers, chills, any other swelling, difficulty breathing or wheezing.    The history is provided by the patient.    Past Medical History:  Diagnosis Date  . Pap smear abnormality of cervix 1998   x 1, all normal since then.     Patient Active Problem List   Diagnosis Date Noted  . Overweight (BMI 25.0-29.9) 12/05/2016  . Vaginal atrophy 05/11/2016    Past Surgical History:  Procedure Laterality Date  . BREAST BIOPSY Right 1997   benign mass  . PARTIAL HYSTERECTOMY  2013    OB History    Gravida Para Term Preterm AB Living   1 1 1     1    SAB TAB Ectopic Multiple Live Births           1       Home Medications    Prior to Admission medications   Medication Sig Start Date End Date Taking? Authorizing Provider  naproxen sodium (ANAPROX) 220 MG tablet Take 220 mg by mouth daily after breakfast.   Yes [provider]  riboflavin (VITAMIN B-2) 100 MG TABS tablet Take 100 mg by mouth daily.   Yes [provider]  vitamin E 100 UNIT capsule Take by mouth daily.   Yes [provider]  sulfamethoxazole-trimethoprim (BACTRIM DS,SEPTRA DS) 800-160 MG tablet Take 1 tablet by mouth 2 (two) times daily. 02/10/17   Payton Mccallum, MD    Family History Family History  Problem Relation Age of Onset  . Cancer - Colon Neg Hx   . Breast cancer Neg Hx   . Ovarian cancer Neg Hx   . Diabetes Neg Hx     Social History Social History  Substance Use Topics  . Smoking status: Never Smoker  . Smokeless tobacco: Never Used  . Alcohol use 1.8 oz/week    3 Glasses of wine per week     Allergies   Tetracyclines &  related   Review of Systems Review of Systems   Physical Exam Triage Vital Signs ED Triage Vitals  Enc Vitals Group     BP 02/10/17 1214 115/64     Pulse Rate 02/10/17 1214 74     Resp 02/10/17 1214 18     Temp 02/10/17 1214 98 F (36.7 C)     Temp Source 02/10/17 1214 Oral     SpO2 02/10/17 1214 98 %     Weight 02/10/17 1212 180 lb (81.6 kg)     Height 02/10/17 1212 5\' 10"  (1.778 m)     Head Circumference --      Peak Flow --      Pain Score 02/10/17 1212 4     Pain Loc --      Pain Edu? --      Excl. in GC? --    No data found.   Updated Vital Signs BP 115/64 (BP Location: Left Arm)   Pulse 74   Temp 98 F (36.7 C) (Oral)   Resp 18   Ht 5\' 10"  (1.778 m)   Wt 180 lb (81.6 kg)   SpO2  98%   BMI 25.83 kg/m   Visual Acuity Right Eye Distance:   Left Eye Distance:   Bilateral Distance:    Right Eye Near:   Left Eye Near:    Bilateral Near:     Physical Exam  Constitutional: She appears well-developed and well-nourished. No distress.  Musculoskeletal:       Left foot: There is tenderness (and blanchable erythema over the skin on the fourth toe) and swelling. There is normal range of motion, no bony tenderness, normal capillary refill, no crepitus, no deformity and no laceration.  Skin: She is not diaphoretic.  Nursing note and vitals reviewed.    UC Treatments / Results  Labs (all labs ordered are listed, but only abnormal results are displayed) Labs Reviewed - No data to display  EKG  EKG Interpretation None       Radiology No results found.  Procedures Procedures (including critical care time)  Medications Ordered in UC Medications - No data to display   Initial Impression / Assessment and Plan / UC Course  I have reviewed the triage vital signs and the nursing notes.  Pertinent labs & imaging results that were available during my care of the patient were reviewed by me and considered in my medical decision making (see chart for  details).      Final Clinical Impressions(s) / UC Diagnoses   Final diagnoses:  Bee sting, accidental or unintentional, initial encounter  Cellulitis of foot, left    New Prescriptions Discharge Medication List as of 02/10/2017  1:47 PM    START taking these medications   Details  sulfamethoxazole-trimethoprim (BACTRIM DS,SEPTRA DS) 800-160 MG tablet Take 1 tablet by mouth 2 (two) times daily., Starting Mon 02/10/2017, Normal       1.diagnosis reviewed with patient 2. rx as per orders above; reviewed possible side effects, interactions, risks and benefits  3. Recommend supportive treatment with warm compresses, elevate foot/leg 4. Follow-up prn if symptoms worsen or don't improve   Payton Mccallumonty, Roschelle Calandra, MD 02/10/17 1407

## 2017-02-10 NOTE — ED Triage Notes (Signed)
Patient states that she had a bee sting 3 days ago. Patient states that it stung her on the bottom of left foot fourth toe. Patient states that area has been increasing in redness and swelling and not improving.

## 2017-12-09 ENCOUNTER — Encounter: Payer: BLUE CROSS/BLUE SHIELD | Admitting: Obstetrics and Gynecology

## 2017-12-11 ENCOUNTER — Other Ambulatory Visit: Payer: Self-pay | Admitting: Obstetrics and Gynecology

## 2017-12-11 DIAGNOSIS — Z1231 Encounter for screening mammogram for malignant neoplasm of breast: Secondary | ICD-10-CM

## 2018-01-02 ENCOUNTER — Encounter: Payer: Self-pay | Admitting: Obstetrics and Gynecology

## 2018-01-19 ENCOUNTER — Encounter (INDEPENDENT_AMBULATORY_CARE_PROVIDER_SITE_OTHER): Payer: Self-pay

## 2018-01-19 ENCOUNTER — Ambulatory Visit
Admission: RE | Admit: 2018-01-19 | Discharge: 2018-01-19 | Disposition: A | Discharge status: Home / Self Care | Payer: BLUE CROSS/BLUE SHIELD | Source: Ambulatory Visit | Attending: Obstetrics and Gynecology | Admitting: Obstetrics and Gynecology

## 2018-01-19 DIAGNOSIS — Z1231 Encounter for screening mammogram for malignant neoplasm of breast: Secondary | ICD-10-CM | POA: Diagnosis present

## 2018-09-26 ENCOUNTER — Ambulatory Visit
Admission: EM | Admit: 2018-09-26 | Discharge: 2018-09-26 | Disposition: A | Discharge status: Home / Self Care | Payer: BLUE CROSS/BLUE SHIELD | Attending: Emergency Medicine | Admitting: Emergency Medicine

## 2018-09-26 ENCOUNTER — Other Ambulatory Visit: Payer: Self-pay

## 2018-09-26 DIAGNOSIS — J069 Acute upper respiratory infection, unspecified: Secondary | ICD-10-CM | POA: Insufficient documentation

## 2018-09-26 LAB — RAPID INFLUENZA A&B ANTIGENS: Influenza B (ARMC): NEGATIVE

## 2018-09-26 LAB — RAPID INFLUENZA A&B ANTIGENS (ARMC ONLY): INFLUENZA A (ARMC): NEGATIVE

## 2018-09-26 MED ORDER — FLUTICASONE PROPIONATE 50 MCG/ACT NA SUSP: 2.0000 | Freq: Every day | NASAL | 0 refills | Status: AC

## 2018-09-26 MED ORDER — IBUPROFEN 600 MG PO TABS: 600.0000 mg | ORAL_TABLET | Freq: Four times a day (QID) | ORAL | 0 refills | Status: AC | PRN

## 2018-09-26 NOTE — ED Provider Notes (Signed)
HPI  SUBJECTIVE:  Margaret Kane is a 47 y.o. female who presents with clear nasal congestion, rhinorrhea, postnasal drip, body aches, sore throat, bilateral ear pain starting yesterday.  She reports a mild cough.  She is going to be around a 46 year old and a 4272-month-old in several days and wants to make sure that she does not have the flu.  Her daughter had a confirmed case of influenza last week.  No change in her hearing, otorrhea, neck stiffness, drooling, trismus, headache, fever.  No wheezing, chest pain, shortness of breath.  No antibiotics in the past month.  No antipyretic in the past 4 to 6 hours.  She got a flu shot this year.  She has tried ibuprofen 600 mg every 8 hours without improvement in symptoms, symptoms are worse with going out to the cold air.  Past medical history negative for asthma, eczema, COPD, smoking, diabetes, hypertension.  LMP: Status post hysterectomy.  PMD: Rolm GalaGrandis, Heidi, MD .   Past Medical History:  Diagnosis Date  . Pap smear abnormality of cervix 1998   x 1, all normal since then.     Past Surgical History:  Procedure Laterality Date  . ABDOMINAL HYSTERECTOMY    . BREAST BIOPSY Right 1997   benign mass  . PARTIAL HYSTERECTOMY  2013    Family History  Problem Relation Age of Onset  . Cancer - Colon Neg Hx   . Breast cancer Neg Hx   . Ovarian cancer Neg Hx   . Diabetes Neg Hx     Social History   Tobacco Use  . Smoking status: Never Smoker  . Smokeless tobacco: Never Used  Substance Use Topics  . Alcohol use: Yes    Alcohol/week: 3.0 standard drinks    Types: 3 Glasses of wine per week  . Drug use: No    No current facility-administered medications for this encounter.   Current Outpatient Medications:  .  magnesium gluconate (MAGONATE) 500 MG tablet, Take 500 mg by mouth daily., Disp: , Rfl:  .  fluticasone (FLONASE) 50 MCG/ACT nasal spray, Place 2 sprays into both nostrils daily., Disp: 16 g, Rfl: 0 .  ibuprofen  (ADVIL,MOTRIN) 600 MG tablet, Take 1 tablet (600 mg total) by mouth every 6 (six) hours as needed., Disp: 30 tablet, Rfl: 0 .  riboflavin (VITAMIN B-2) 100 MG TABS tablet, Take 100 mg by mouth daily., Disp: , Rfl:  .  vitamin E 100 UNIT capsule, Take by mouth daily., Disp: , Rfl:   Allergies  Allergen Reactions  . Tetracyclines & Related      ROS  As noted in HPI.   Physical Exam  BP 130/87 (BP Location: Left Arm)   Pulse 77   Temp 98.2 F (36.8 C) (Oral)   Resp 16   Ht 5\' 10"  (1.778 m)   Wt 83.9 kg   SpO2 97%   BMI 26.54 kg/m   Constitutional: Well developed, well nourished, no acute distress Eyes:  EOMI, conjunctiva normal bilaterally HENT: Normocephalic, atraumatic,mucus membranes moist.  Clear nasal congestion.  Erythematous, swollen turbinates.  No sinus tenderness.  TMs normal bilaterally.  Slightly erythematous oropharynx, normal tonsils, uvula midline.  Positive cobblestoning and postnasal drip. Neck: Positive left-sided cervical lymphadenopathy Respiratory: Normal inspiratory effort lungs clear bilaterally, good air movement cardiovascular: Normal rate, regular rhythm no murmurs, rubs, gallops GI: nondistended skin: No rash, skin intact Musculoskeletal: no deformities Neurologic: Alert & oriented x 3, no focal neuro deficits Psychiatric: Speech and behavior appropriate  ED Course   Medications - No data to display  Orders Placed This Encounter  Procedures  . Rapid Influenza A&B Antigens ([facility] only)    Standing Status:   Standing    Number of Occurrences:   1  . Droplet precaution    Standing Status:   Standing    Number of Occurrences:   1    Results for orders placed or performed during the hospital encounter of 09/26/18 (from the past 24 hour(s))  Rapid Influenza A&B Antigens ([facility] only)     Status: None   Collection Time: 09/26/18  9:50 AM  Result Value Ref Range   Influenza A ([facility]) NEGATIVE NEGATIVE   Influenza B ([facility]) NEGATIVE NEGATIVE    No results found.  ED Clinical Impression  Upper respiratory tract infection, unspecified type   ED Assessment/Plan  Checking flu as patient will be around 47-year-old and a 6023-month-old.  Otherwise if negative, patient has a URI.  Home with Flonase, saline nasal irrigation with a Lloyd HugerNeil med rinse and distilled water as often as she wants, ibuprofen 600 mg combined with 1 g of Tylenol 3-4 times a day as needed for pain, Mucinex D, Benadryl/Maalox mixture.  Follow-up with PMD as needed.  Rapid flu negative.  Plan as above  Discussed  MDM, treatment plan, and plan for follow-up with patient.  patient agrees with plan.   Meds ordered this encounter  Medications  . fluticasone (FLONASE) 50 MCG/ACT nasal spray    Sig: Place 2 sprays into both nostrils daily.    Dispense:  16 g    Refill:  0  . ibuprofen (ADVIL,MOTRIN) 600 MG tablet    Sig: Take 1 tablet (600 mg total) by mouth every 6 (six) hours as needed.    Dispense:  30 tablet    Refill:  0    *This clinic note was created using Scientist, clinical (histocompatibility and immunogenetics)Dragon dictation software. Therefore, there may be occasional mistakes despite careful proofreading.   ?    Domenick GongMortenson, Cory Rama, MD 09/27/18 [phone]

## 2018-09-26 NOTE — Discharge Instructions (Addendum)
Your rapid flu is negative.  Flonase, saline nasal irrigation with a Lloyd HugerNeil med rinse and distilled water as often as you want, ibuprofen 600 mg combined with 1 g of Tylenol 3-4 times a day as needed for pain, Mucinex D, Benadryl/Maalox mixture.5 cc of each mixed together and a small glass, hold it in your mouth for as long as you can, gargle and then swallow.

## 2018-09-26 NOTE — ED Triage Notes (Signed)
Pt reports bilateral ear pain, sore throat. No fever. Getting ready to travel and wanted to be checked out. Pain 4/10

## 2024-01-18 ENCOUNTER — Ambulatory Visit
Admission: EM | Admit: 2024-01-18 | Discharge: 2024-01-18 | Disposition: A | Discharge status: Home / Self Care | Attending: Physician Assistant | Admitting: Physician Assistant

## 2024-01-18 ENCOUNTER — Encounter: Payer: Self-pay | Admitting: Emergency Medicine

## 2024-01-18 DIAGNOSIS — H00024 Hordeolum internum left upper eyelid: Secondary | ICD-10-CM

## 2024-01-18 MED ORDER — AMOXICILLIN-POT CLAVULANATE 875-125 MG PO TABS: 1.0000 | ORAL_TABLET | Freq: Two times a day (BID) | ORAL | 0 refills | Status: AC

## 2024-01-18 MED ORDER — ERYTHROMYCIN 5 MG/GM OP OINT: TOPICAL_OINTMENT | OPHTHALMIC | 0 refills | Status: AC

## 2024-01-18 NOTE — ED Provider Notes (Signed)
 [facility]    CSN: [id number] Arrival date & time: 01/18/24  0818      History   Chief Complaint Chief Complaint  Patient presents with   Stye    HPI Margaret Kane is a 53 y.o. female presenting for 2-week history of suspected stye of the left upper eyelid.  Reports swelling, pain and a lump.  Has had some drainage in the mornings.  Condition is worsened over the past 2 days.  No associated fever.  Has not had any eye redness.  Reports the eye seems little blurry at times.  She has been using warm compresses without relief.  HPI  Past Medical History:  Diagnosis Date   History of dysplastic nevus    Pap smear abnormality of cervix 1998   x 1, all normal since then.     Patient Active Problem List   Diagnosis Date Noted   Overweight (BMI 25.0-29.9) 12/05/2016   Vaginal atrophy 05/11/2016    Past Surgical History:  Procedure Laterality Date   ABDOMINAL HYSTERECTOMY     BREAST BIOPSY Right 1997   benign mass   PARTIAL HYSTERECTOMY  2013    OB History     Gravida  1   Para  1   Term  1   Preterm      AB      Living  1      SAB      IAB      Ectopic      Multiple      Live Births  1            Home Medications    Prior to Admission medications   Medication Sig Start Date End Date Taking? Authorizing Provider  amoxicillin-clavulanate (AUGMENTIN) 875-125 MG tablet Take 1 tablet by mouth every 12 (twelve) hours for 7 days. 01/18/24 01/25/24 Yes Nancy Axon B, PA-C  erythromycin ophthalmic ointment Place a 1/2 inch ribbon of ointment into the upper eyelid q6h x 1-2 weeks 01/18/24  Yes Nancy Axon B, PA-C  fluticasone (FLONASE) 50 MCG/ACT nasal spray Place 2 sprays into both nostrils daily. 09/26/18   Ethlyn Herd, MD  ibuprofen (ADVIL,MOTRIN) 600 MG tablet Take 1 tablet (600 mg total) by mouth every 6 (six) hours as needed. 09/26/18   Ethlyn Herd, MD  magnesium gluconate (MAGONATE) 500 MG tablet Take 500 mg by  mouth daily.   Yes [provider]  riboflavin (VITAMIN B-2) 100 MG TABS tablet Take 100 mg by mouth daily.   Yes [provider]  vitamin E 100 UNIT capsule Take by mouth daily.   Yes [provider]    Family History Family History  Problem Relation Age of Onset   Cancer - Colon Neg Hx    Breast cancer Neg Hx    Ovarian cancer Neg Hx    Diabetes Neg Hx     Social History Social History   Tobacco Use   Smoking status: Never   Smokeless tobacco: Never  Vaping Use   Vaping status: Never Used  Substance Use Topics   Alcohol use: Yes    Alcohol/week: 3.0 standard drinks of alcohol    Types: 3 Glasses of wine per week   Drug use: No     Allergies   Tetracyclines & related   Review of Systems Review of Systems  HENT:  Positive for facial swelling. Negative for congestion.   Eyes:  Negative for photophobia, pain, discharge, redness, itching and visual  disturbance.  Respiratory:  Negative for cough.   Skin:  Positive for color change.  Neurological:  Negative for dizziness and headaches.     Physical Exam Triage Vital Signs ED Triage Vitals  Encounter Vitals Group     BP      Systolic BP Percentile      Diastolic BP Percentile      Pulse      Resp      Temp      Temp src      SpO2      Weight      Height      Head Circumference      Peak Flow      Pain Score      Pain Loc      Pain Education      Exclude from Growth Chart    No data found.  Updated Vital Signs BP (!) 148/85 (BP Location: Right Arm)   Pulse 69   Temp 98.4 F (36.9 C) (Oral)   Resp 16   Ht 5\' 10"  (1.778 m)   Wt 184 lb 15.5 oz (83.9 kg)   SpO2 96%   BMI 26.54 kg/m   Visual Acuity Right Eye Distance: 20/30 uncorrected Left Eye Distance: 20/50 uncorrected Bilateral Distance: 20/30 uncorrected    Physical Exam Vitals and nursing note reviewed.  Constitutional:      General: She is not in acute distress.    Appearance: Normal appearance. She is not  ill-appearing or toxic-appearing.  HENT:     Head: Normocephalic and atraumatic.  Eyes:     General: No scleral icterus.       Right eye: No discharge.        Left eye: Hordeolum present.No discharge.     Conjunctiva/sclera: Conjunctivae normal.     Right eye: Right conjunctiva is not injected.     Left eye: Left conjunctiva is not injected.     Comments: Large stye left internal upper eyelid with erythema and swelling of eyelid.  Area is diffusely tender but mostly sore over the stye.  Cardiovascular:     Rate and Rhythm: Normal rate.  Pulmonary:     Effort: Pulmonary effort is normal. No respiratory distress.  Musculoskeletal:     Cervical back: Neck supple.  Skin:    General: Skin is dry.  Neurological:     General: No focal deficit present.     Mental Status: She is alert. Mental status is at baseline.     Motor: No weakness.     Gait: Gait normal.  Psychiatric:        Mood and Affect: Mood normal.        Behavior: Behavior normal.      UC Treatments / Results  Labs (all labs ordered are listed, but only abnormal results are displayed) Labs Reviewed - No data to display  EKG   Radiology No results found.  Procedures Procedures (including critical care time)  Medications Ordered in UC Medications - No data to display  Initial Impression / Assessment and Plan / UC Course  I have reviewed the triage vital signs and the nursing notes.  Pertinent labs & imaging results that were available during my care of the patient were reviewed by me and considered in my medical decision making (see chart for details).   53 year old female presents for large stye of the left upper eyelid for the past 2 weeks which is gotten worse over the past 2 days.  Has been using warm compresses which have not been helpful.  Vision grossly intact.  Thigh is large and has apparently worsened over the last couple days so we will try an oral antibiotic to see if that helps.  Has allergy to  tetracycline so I sent Augmentin.  Also sent erythromycin ophthalmic ointment and advised warm compresses.  Reviewed following up with eye specialist if not improving over the next 1 to 2 weeks or symptoms worsen.   Final Clinical Impressions(s) / UC Diagnoses   Final diagnoses:  Hordeolum internum left upper eyelid     Discharge Instructions      -You have a very large stye.  Symptoms been persistent for couple weeks and worsening we can try an oral antibiotic but as discussed we normally do not use oral antibiotics to treat this.  Warm compresses are the mainstay of treatment.  I also sent an ointment to the pharmacy.  It can take a couple weeks to clear up but if you feel like it is getting worse you should consider following up with an eye specialist.     ED Prescriptions     Medication Sig Dispense Auth. Provider   amoxicillin-clavulanate (AUGMENTIN) 875-125 MG tablet Take 1 tablet by mouth every 12 (twelve) hours for 7 days. 14 tablet Nancy Axon B, PA-C   erythromycin ophthalmic ointment Place a 1/2 inch ribbon of ointment into the upper eyelid q6h x 1-2 weeks 3.5 g Floydene Hy, PA-C      PDMP not reviewed this encounter.   Floydene Hy, PA-C 01/18/24 [phone]

## 2024-01-18 NOTE — Discharge Instructions (Addendum)
-  You have a very large stye.  Symptoms been persistent for couple weeks and worsening we can try an oral antibiotic but as discussed we normally do not use oral antibiotics to treat this.  Warm compresses are the mainstay of treatment.  I also sent an ointment to the pharmacy.  It can take a couple weeks to clear up but if you feel like it is getting worse you should consider following up with an eye specialist.

## 2024-01-18 NOTE — ED Triage Notes (Signed)
 Pt c/o stye on left upper eye lid. Started about 2 weeks ago. She states she is starting to feel pressure on the eye and have some blurry vision in the eye.

## 2024-11-03 ENCOUNTER — Encounter: Payer: Self-pay | Admitting: Physical Therapy

## 2024-11-03 ENCOUNTER — Ambulatory Visit: Admitting: Physical Therapy

## 2024-11-03 DIAGNOSIS — M25561 Pain in right knee: Secondary | ICD-10-CM | POA: Insufficient documentation

## 2024-11-03 DIAGNOSIS — M25661 Stiffness of right knee, not elsewhere classified: Secondary | ICD-10-CM | POA: Diagnosis present

## 2024-11-03 DIAGNOSIS — M6281 Muscle weakness (generalized): Secondary | ICD-10-CM | POA: Insufficient documentation

## 2024-11-03 DIAGNOSIS — R262 Difficulty in walking, not elsewhere classified: Secondary | ICD-10-CM | POA: Insufficient documentation

## 2024-11-03 NOTE — Therapy (Signed)
 " OUTPATIENT PHYSICAL THERAPY KNEE POST-OP EVALUATION  Patient Name: Margaret Kane MRN: [id number] DOB:03-Mar-1971, 54 y.o., female Today's Date: 11/03/2024  END OF SESSION:  PT End of Session - 11/03/24 1407     Visit Number 1    Number of Visits 13    Date for Recertification  01/06/25    Authorization Type [employer] max 30 visits per year; year restarts 04/06/25    PT Start Time 1411    PT Stop Time 1453    PT Time Calculation (min) 42 min    Activity Tolerance Patient tolerated treatment well    Behavior During Therapy WFL for tasks assessed/performed          Past Medical History:  Diagnosis Date   History of dysplastic nevus    Pap smear abnormality of cervix 1998   x 1, all normal since then.    Past Surgical History:  Procedure Laterality Date   ABDOMINAL HYSTERECTOMY     BREAST BIOPSY Right 1997   benign mass   PARTIAL HYSTERECTOMY  2013   Patient Active Problem List   Diagnosis Date Noted   Overweight (BMI 25.0-29.9) 12/05/2016   Vaginal atrophy 05/11/2016    PCP: Rojelio Loader, MD  REFERRING PROVIDER: Adair Lamar Hummer, MD  REFERRING DIAG: Chronic pain of right knee  RATIONALE FOR EVALUATION AND TREATMENT: Rehabilitation  THERAPY DIAG: Acute pain of right knee  Stiffness of right knee, not elsewhere classified  Difficulty in walking, not elsewhere classified  Muscle weakness (generalized)  ONSET DATE: R knee medial unicompartmental arthroplasty (DOS: 11/02/24)  FOLLOW-UP APPT SCHEDULED WITH REFERRING PROVIDER: Yes ; 2-week post-op 11/15/24   SUBJECTIVE:                                                                                                                                                                                         SUBJECTIVE STATEMENT:  Pt is a 54 year old female s/p R knee medial unicompartmental arthroplasty (DOS: 11/02/24).  PERTINENT HISTORY: Pt is a 54 year old female s/p R knee medial unicompartmental  arthroplasty (DOS: 11/02/24).  Patient currently reports she was home by 3 PM yesterday after completion of R partial knee arthroplasty. Patient reports getting up every hour and walking around house. Patient reports no significant complications to since the procedure. Pt received instructions on showering/self-care. Pt is using frozen gel packs for pain and swelling management. Pt is using hydrocodone for pain control. Patient reports pain is better post-operatively compared to pre-op pain.   PAIN:   Pain Intensity: Present: 2/10, Best: 2/10, Worst: 6/10 Pain location: Popliteal region, tibial plateau/anterior knee region Pain quality: burning  Swelling: Yes  Numbness/Tingling: Yes; some numbness in anterior leg/shin; nerve block is wearing off Aggravating factors: transferring from commode, transfer out of bed, stairs;  Relieving factors: Hydrocodone, ice History of prior back, hip, or knee injury, pain, surgery, or therapy: Yes; Hx of knee pain and repeated injections  Imaging: No  Prior level of function: Independent Occupational demands: Not working at this time Hobbies: Pottery, biking  Red flags: Negative for personal history of cancer, chills/fever, night sweats, nausea, vomiting, unexplained weight gain/loss, unrelenting pain  PRECAUTIONS: None  WEIGHT BEARING RESTRICTIONS: No  FALLS: Has patient fallen in last 6 months? No  Living Environment Lives with: lives with their spouse Lives in: House/apartment; 2 steps to get in from garage, no handrail;  Adaptive equipment: walker only, no AE added to home   Patient Goals: Back to driving;    OBJECTIVE:   Patient Surveys  LEFS  Extreme difficulty/unable (0), Quite a bit of difficulty (1), Moderate difficulty (2), Little difficulty (3), No difficulty (4) Survey date:   11/03/24  Any of your usual work, housework or school activities 1  2. Usual hobbies, recreational or sporting activities 1  3. Getting into/out of the bath  4  4. Walking between rooms 2  5. Putting on socks/shoes 1  6. Squatting  0  7. Lifting an object, like a bag of groceries from the floor 1  8. Performing light activities around your home 1  9. Performing heavy activities around your home 0  10. Getting into/out of a car 0  11. Walking 2 blocks 0  12. Walking 1 mile 0  13. Going up/down 10 stairs (1 flight) 0  14. Standing for 1 hour 0  15.  sitting for 1 hour 4  16. Running on even ground 0  17. Running on uneven ground 0  18. Making sharp turns while running fast 0  19. Hopping  0  20. Rolling over in bed 0  Score total:  16/80     Cognition Patient is oriented to person, place, and time.  Recent memory is intact.  Remote memory is intact.  Attention span and concentration are intact.  Expressive speech is intact.  Patient's fund of knowledge is within normal limits for educational level.    Gross Musculoskeletal Assessment Tremor: None Bulk: Mild R quad atrophy Tone: Normal No signs of acute infection  GAIT: Distance walked: 80 ft Assistive device utilized: Walker - 2 wheeled Level of assistance: SBA Comments: Decreased terminal knee extension at terminal swing, good heel to toe progression and R weight shift; mild R lateral lean with R stance phase   AROM AROM (Normal range in degrees) AROM 11/03/24  Hip Right Left  Flexion (125)    Extension (15)    Abduction (40)    Adduction     Internal Rotation (45)    External Rotation (45)        Knee    Flexion (135) 67 WNL  Extension (0) -4 WNL      Ankle    Dorsiflexion (20) WNL WNL  Plantarflexion (50)    Inversion (35)    Eversion (15    (* = pain; Blank rows = not tested)  LE MMT: MMT (out of 5) Right 11/03/24 Left 11/03/24  Hip flexion 5 5  Hip extension    Hip abduction Seated 5 Seated 5  Hip adduction Seated 5 Seated 5   Hip internal rotation    Hip external rotation    Knee flexion 4 5  Knee extension 4- 5-  Ankle dorsiflexion    Ankle  plantarflexion    Ankle inversion    Ankle eversion    (* = pain; Blank rows = not tested)  Sensation Grossly intact to light touch bilateral LEs as determined by testing dermatomes L2-S2. Proprioception, and hot/cold testing deferred on this date.  Reflexes Deferred   Palpation Location LEFT  RIGHT           Quadriceps  1  Medial Hamstrings    Lateral Hamstrings    Lateral Hamstring tendon    Medial Hamstring tendon    Quadriceps tendon    Patella  1  Patellar Tendon  1  Tibial Tuberosity  1  Medial joint line  2  Lateral joint line    MCL    LCL    Adductor Tubercle    Pes Anserine tendon    Infrapatellar fat pad    Fibular head    Popliteal fossa    (Blank rows = not tested) Graded on 0-4 scale (0 = no pain, 1 = pain, 2 = pain with wincing/grimacing/flinching, 3 = pain with withdrawal, 4 = unwilling to allow palpation), (Blank rows = not tested)  Passive Accessory Motion Deferred due to dressing/acute surgical incision   VASCULAR Dorsalis pedis and posterior tibial pulses are palpable No sign of acute DVT    TODAY'S TREATMENT:    Therapeutic Exercise - for HEP establishment, discussion on appropriate exercise/activity modification, PT education  Patient education on current condition, role of PT, prognosis, plan of care. Discussed DVT and infection prophylaxis.   Reviewed baseline home exercises and provided handout for [employer] program (see Access Code); tactile cueing and therapist demonstration utilized as needed for carryover of proper technique to HEP.     PATIENT EDUCATION:  Education details: see above for patient education details Person educated: Patient Education method: Explanation, Demonstration, and Handouts Education comprehension: verbalized understanding and returned demonstration   HOME EXERCISE PROGRAM:  Access Code: [email or web address] URL: [email or web address] Date: 11/03/2024 Prepared by: Venetia Endo  Exercises -  Supine Quad Set  - 2 x daily - 7 x weekly - 2 sets - 10 reps - 5sec hold - Supine Heel Slide with Strap  - 2 x daily - 7 x weekly - 2 sets - 10 reps - 3-5sec hold -Pt continuing regular walking routine in home  ASSESSMENT:  CLINICAL IMPRESSION: Patient is a 54 y.o. female who was seen today for physical therapy evaluation and treatment for R knee medial unicompartmental arthroplasty (DOS: 11/02/24) with expected post-operative impairments in quad/HS strength, knee ROM, stiffness, R knee swelling, and post-op pain. Pt has markedly improved pain status per report after surgery and exhibits safe, functional gait for home-level distance with mild gait deviations as expected for early post-op timeframe. Pt has current activity limitations in gait, transferring, stair and obstacle negotiation, and bed mobility. Pt will continue to benefit from skilled PT services to address deficits and improve function.   OBJECTIVE IMPAIRMENTS: Abnormal gait, decreased mobility, decreased ROM, decreased strength, hypomobility, impaired flexibility, and pain.   ACTIVITY LIMITATIONS: lifting, bending, standing, squatting, stairs, transfers, bed mobility, toileting, dressing, and locomotion level  PARTICIPATION LIMITATIONS: meal prep, cleaning, driving, shopping, community activity, and participation in [employer]  PERSONAL FACTORS: No significant personal factors or comorbidities are affecting patient's functional outcome.   REHAB POTENTIAL: Excellent  CLINICAL DECISION MAKING: Stable/uncomplicated  EVALUATION COMPLEXITY: Low   GOALS: Goals reviewed with patient? Yes  SHORT TERM GOALS: Target date: 11/24/2024  Pt will be independent with HEP to improve strength and decrease knee pain to improve pain-free function at home and work. Baseline: 11/03/24: Baseline HEP initiated Goal status: INITIAL  By 4 weeks, patient will attain at least 90 deg of R knee flexion as needed for progression to functional knee  ROM as needed for transferring and self-care ADLs.  Baseline: 11/03/24: Baseline HEP initiated Goal status: INITIAL   LONG TERM GOALS: Target date: 12/30/2024  Pt will have 0-120 deg of R knee AROM indicative of attaining functional ROM s/p unicompartmental arthroplasty as needed for self-care ADLs, terminal extension for normal gait, and intermittently for bed mobility tasks Baseline: 11/03/24: -4-67 Goal status: INITIAL  2.  Pt will decrease worst knee pain by at least 3 points on the NPRS in order to demonstrate clinically significant reduction in knee pain. Baseline: 11/03/24: 6/10 pain Goal status: INITIAL  3.  Pt will decrease LEFS score by at least 9 points in order demonstrate clinically significant reduction in knee pain/disability.       Baseline: 11/03/24: 16/80  Goal status: INITIAL  4.  Pt will increase strength of R quadriceps and hamstrings to at least 4+/5 MMT grade in order to demonstrate improvement in strength and function  Baseline: 11/03/24: 4- to 4/5 strength for R knee Goal status: INITIAL  5.  Pt will ambulate at community-level without AD and no significant gait deviation or reproduction of pain as needed for independent functional mobility Baseline: 11/03/24: Limited home-level ambulation at this time with intermittent use of 2-wheeled walker Goal status: INITIAL  PLAN: PT FREQUENCY: 1-2x/week  PT DURATION: 6 weeks  PLANNED INTERVENTIONS: Therapeutic exercises, Therapeutic activity, Neuromuscular re-education, Balance training, Gait training, Patient/Family education, Self Care, Joint mobilization, Joint manipulation, Vestibular training, Canalith repositioning, Orthotic/Fit training, DME instructions, Dry Needling, Electrical stimulation, Spinal manipulation, Spinal mobilization, Cryotherapy, Moist heat, Taping, Traction, Ultrasound, Ionotophoresis 4mg /ml Dexamethasone, Manual therapy, and Re-evaluation.  PLAN FOR NEXT SESSION: R knee progressive ROM, quad  activation, SLR 3-way. Progressive stepping and gait drills with gradual wean down from AD.    Venetia Endo, PT, DPT #[id number]  Venetia ONEIDA Endo, PT 11/03/2024, 2:09 PM  "

## 2024-11-09 ENCOUNTER — Ambulatory Visit: Admitting: Physical Therapy

## 2024-11-09 ENCOUNTER — Encounter: Payer: Self-pay | Admitting: Physical Therapy

## 2024-11-09 DIAGNOSIS — R262 Difficulty in walking, not elsewhere classified: Secondary | ICD-10-CM

## 2024-11-09 DIAGNOSIS — M25661 Stiffness of right knee, not elsewhere classified: Secondary | ICD-10-CM

## 2024-11-09 DIAGNOSIS — M6281 Muscle weakness (generalized): Secondary | ICD-10-CM

## 2024-11-09 DIAGNOSIS — M25561 Pain in right knee: Secondary | ICD-10-CM

## 2024-11-16 ENCOUNTER — Ambulatory Visit: Admitting: Physical Therapy

## 2024-11-18 ENCOUNTER — Ambulatory Visit: Admitting: Physical Therapy

## 2024-11-23 ENCOUNTER — Ambulatory Visit: Admitting: Physical Therapy

## 2024-11-25 ENCOUNTER — Ambulatory Visit: Admitting: Physical Therapy

## 2024-11-30 ENCOUNTER — Ambulatory Visit: Admitting: Physical Therapy

## 2024-12-02 ENCOUNTER — Ambulatory Visit: Admitting: Physical Therapy

## 2024-12-07 ENCOUNTER — Ambulatory Visit: Admitting: Physical Therapy

## 2024-12-09 ENCOUNTER — Ambulatory Visit: Admitting: Physical Therapy

## 2024-12-14 ENCOUNTER — Ambulatory Visit: Admitting: Physical Therapy

## 2024-12-16 ENCOUNTER — Ambulatory Visit: Admitting: Physical Therapy

## 2024-12-21 ENCOUNTER — Ambulatory Visit: Admitting: Physical Therapy

## 2024-12-23 ENCOUNTER — Ambulatory Visit: Admitting: Physical Therapy
